# Patient Record
Sex: Female | Born: 1960 | Race: White | Hispanic: No | Marital: Married | Smoking: Former smoker
Health system: Southern US, Community
[De-identification: ages and names within clinical notes are randomized; demographics above are authoritative.]

---

## 2009-12-21 ENCOUNTER — Emergency Department: Payer: Self-pay | Admitting: Unknown Physician Specialty

## 2016-02-27 ENCOUNTER — Encounter: Payer: Self-pay | Admitting: Podiatry

## 2016-02-27 ENCOUNTER — Ambulatory Visit: Payer: BLUE CROSS/BLUE SHIELD

## 2016-02-27 ENCOUNTER — Ambulatory Visit (INDEPENDENT_AMBULATORY_CARE_PROVIDER_SITE_OTHER): Payer: BLUE CROSS/BLUE SHIELD | Admitting: Podiatry

## 2016-02-27 ENCOUNTER — Ambulatory Visit (INDEPENDENT_AMBULATORY_CARE_PROVIDER_SITE_OTHER): Payer: BLUE CROSS/BLUE SHIELD

## 2016-02-27 VITALS — BP 123/77 | HR 81 | Resp 16

## 2016-02-27 DIAGNOSIS — M898X9 Other specified disorders of bone, unspecified site: Secondary | ICD-10-CM

## 2016-02-27 DIAGNOSIS — M779 Enthesopathy, unspecified: Secondary | ICD-10-CM | POA: Diagnosis not present

## 2016-02-27 DIAGNOSIS — M722 Plantar fascial fibromatosis: Secondary | ICD-10-CM

## 2016-02-27 DIAGNOSIS — M79672 Pain in left foot: Secondary | ICD-10-CM

## 2016-02-27 MED ORDER — METHYLPREDNISOLONE 4 MG PO TBPK: ORAL_TABLET | ORAL | 0 refills | Status: AC

## 2016-02-27 MED ORDER — MELOXICAM 15 MG PO TABS: 15.0000 mg | ORAL_TABLET | Freq: Every day | ORAL | 3 refills | Status: DC

## 2016-02-27 MED ORDER — METHYLPREDNISOLONE 4 MG PO TBPK: ORAL_TABLET | ORAL | 0 refills | Status: DC

## 2016-02-27 MED ORDER — MELOXICAM 15 MG PO TABS: 15.0000 mg | ORAL_TABLET | Freq: Every day | ORAL | 3 refills | Status: AC

## 2016-02-27 NOTE — Progress Notes (Signed)
   Subjective:    Patient ID: Catherine Newton, female    DOB: [birth year], 55 y.o.   MRN: [id number]  HPI: She presents today chief complaint of plantar heel pain times the past 3-4 months. Lorazepam 2 band saw Dr. Milinda Caveurnow to clinic who gave her an injection. She is also having tingling along the lateral aspect of the left foot she's doing stretching exercises and she is wearing a support sleeve over-the-counter. She is also wearing over-the-counter inserts with no help. She is concerned about it irritation between the fourth and fifth digits of the right foot. He states that his red area between the toes primarily on the fifth she tries to keep cushion between the toes.  Review of Systems  All other systems reviewed and are negative.      Objective:   Physical Exam: Vital signs are stable she is alert and oriented 3. Pulses are strongly palpable. Neurologic sensorium is intact. Deep tendon reflexes are intact. Muscle strength was 5 over 5 dorsiflexion plantar flexors and inverters and everters all intrinsic musculature is intact. She has pain on palpation medially over the left heel pain on palpation as well as formal plane range of motion of the fourth and fifth metatarsocuboid articulation of the left foot. Right foot demonstrate mild rotated hammertoe deformity fifth right. Radiographs taken today bilaterally demonstrate soft tissue increase in density of plantar fascia calcaneal insertion site of the left foot. No open lesions or wounds. Right foot does demonstrate adductor varus rotated hammertoe deformity with a medial spur to the fifth distal phalanx right. This was resulting and reactive hyperkeratosis and skin irritation and breakdown. No signs of infection at this point.        Assessment & Plan:  Assessment: Plantar fasciitis left foot. Lateral compensatory syndrome left foot.  Plan: Start her on a Medrol Dosepak to be followed by meloxicam. Injected the left heel today with Kenalog  and local anesthetic question plantar fascia brace and a night splint. Discussed appropriate shoe gear stretching exercises ice therapy and shoe modifications. I will follow up with her in 1 month. Many consider orthotics.

## 2016-02-27 NOTE — Patient Instructions (Signed)

## 2016-04-02 ENCOUNTER — Ambulatory Visit (INDEPENDENT_AMBULATORY_CARE_PROVIDER_SITE_OTHER): Payer: BLUE CROSS/BLUE SHIELD | Admitting: Podiatry

## 2016-04-02 DIAGNOSIS — M722 Plantar fascial fibromatosis: Secondary | ICD-10-CM | POA: Diagnosis not present

## 2016-04-02 NOTE — Progress Notes (Signed)
She presents today states that she is approximate 75-80% improved as she refers her plantar fasciitis of her left foot. She states that she has not been wearing the night splint at all and occasionally wears plantar fascia brace. She does relate that she has been taking her meloxicam regularly.  Objective: Vital signs are stable she's alert and oriented 3. Pulses are palpable. Neurologic sensorium is intact degenerative flexor intact 15 on palpation medial continue to build the left heel.  Assessment: Plantar fasciitis left.  Plan: Injected left heel today encouragement pressure brace and night splint use as well as continue use of her anti-inflammatories remember to aspirate how [place] went.

## 2017-11-25 ENCOUNTER — Other Ambulatory Visit: Payer: Self-pay | Admitting: Student

## 2017-11-25 DIAGNOSIS — Z1231 Encounter for screening mammogram for malignant neoplasm of breast: Secondary | ICD-10-CM

## 2019-06-09 ENCOUNTER — Other Ambulatory Visit: Payer: Self-pay | Admitting: Student

## 2019-06-09 DIAGNOSIS — Z1231 Encounter for screening mammogram for malignant neoplasm of breast: Secondary | ICD-10-CM

## 2019-07-07 ENCOUNTER — Ambulatory Visit
Admission: RE | Admit: 2019-07-07 | Discharge: 2019-07-07 | Disposition: A | Discharge status: Home / Self Care | Payer: BC Managed Care – PPO | Source: Ambulatory Visit | Attending: Student | Admitting: Student

## 2019-07-07 ENCOUNTER — Encounter: Payer: Self-pay | Admitting: Radiology

## 2019-07-07 DIAGNOSIS — Z1231 Encounter for screening mammogram for malignant neoplasm of breast: Secondary | ICD-10-CM | POA: Diagnosis not present

## 2021-10-12 IMAGING — MG DIGITAL SCREENING BILAT W/ TOMO W/ CAD
6 of 10 series · 6 of 30 positions shown · non-contrast
Comparison: Previous exam(s).

CLINICAL DATA: Screening.

EXAM:
DIGITAL SCREENING BILATERAL MAMMOGRAM WITH TOMO AND CAD

[R CC synth-2D (1 of 2)]
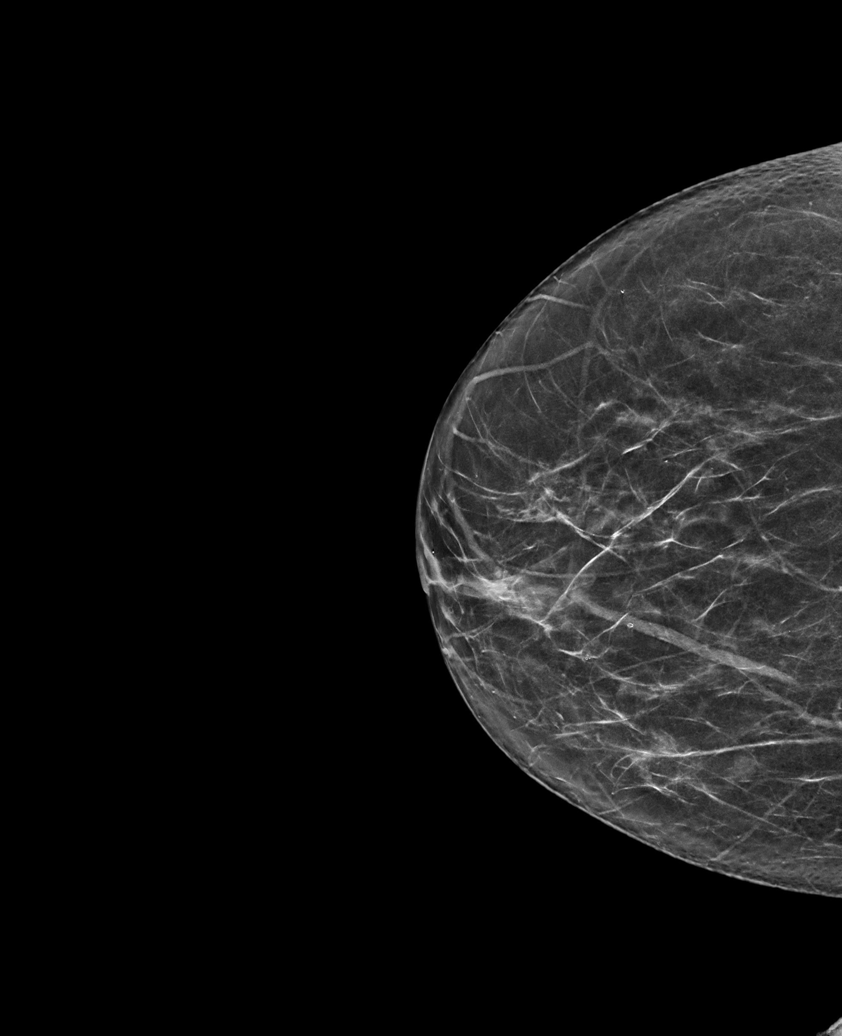

[R CC synth-2D (2 of 2)]
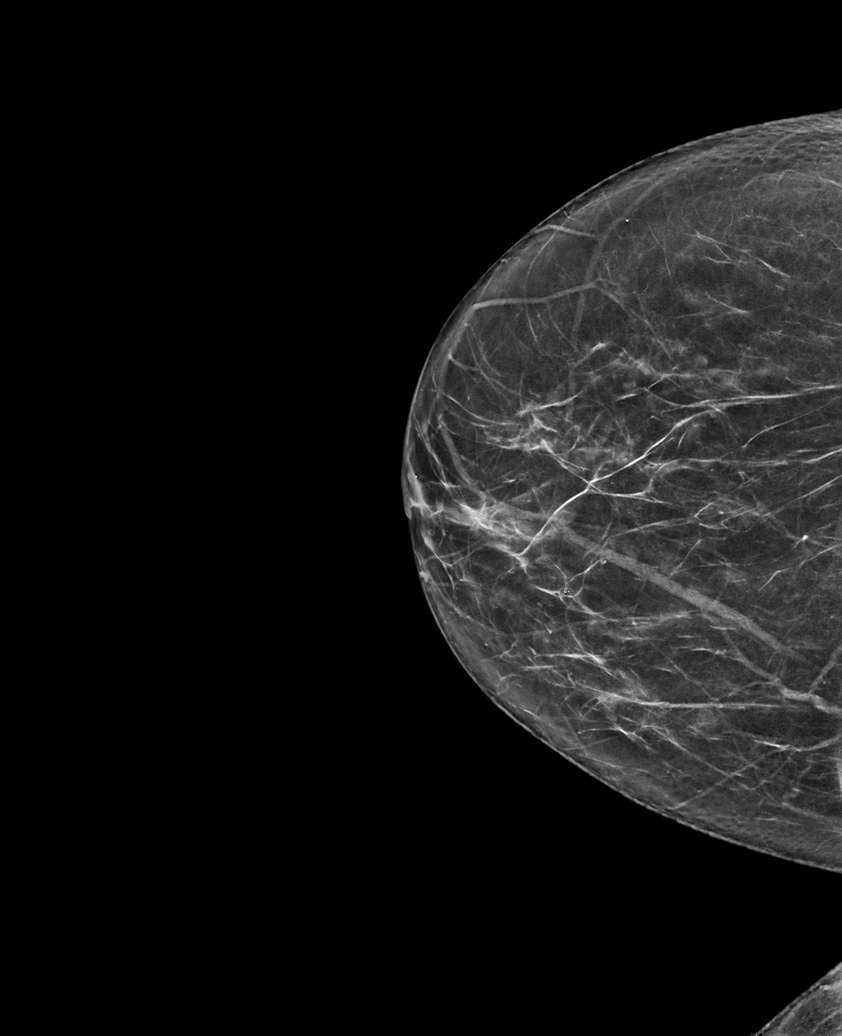

[R MLO synth-2D]
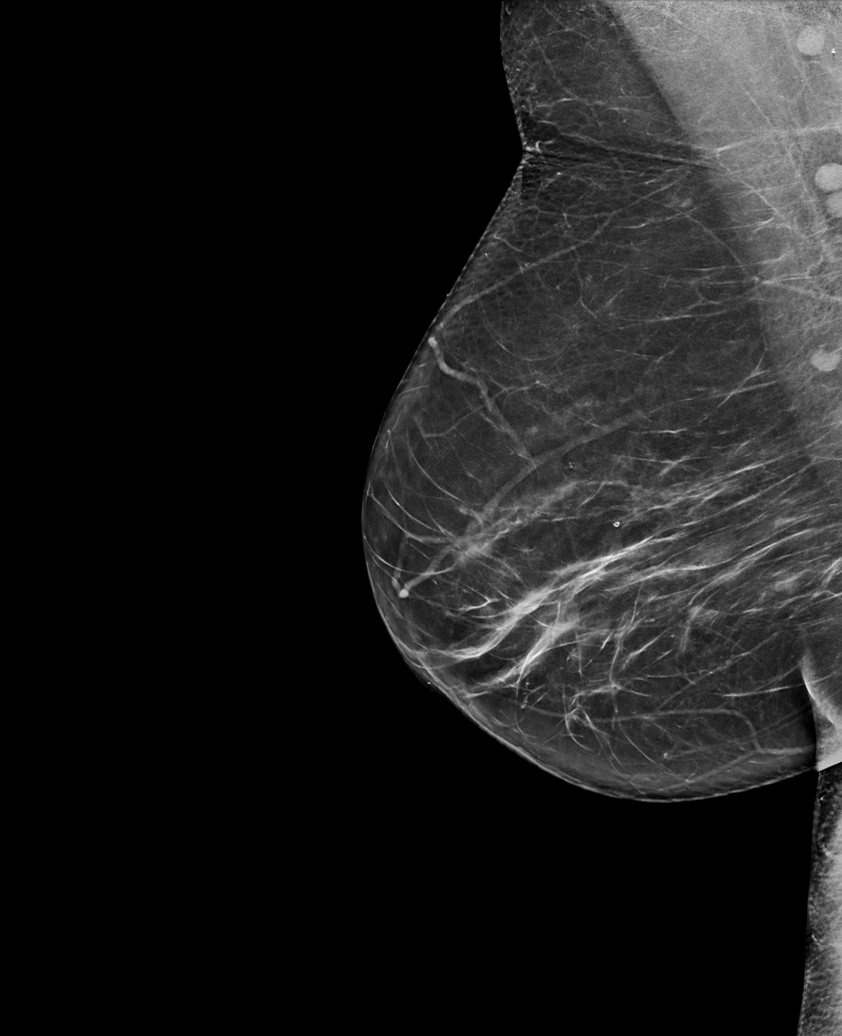

[L CC synth-2D]
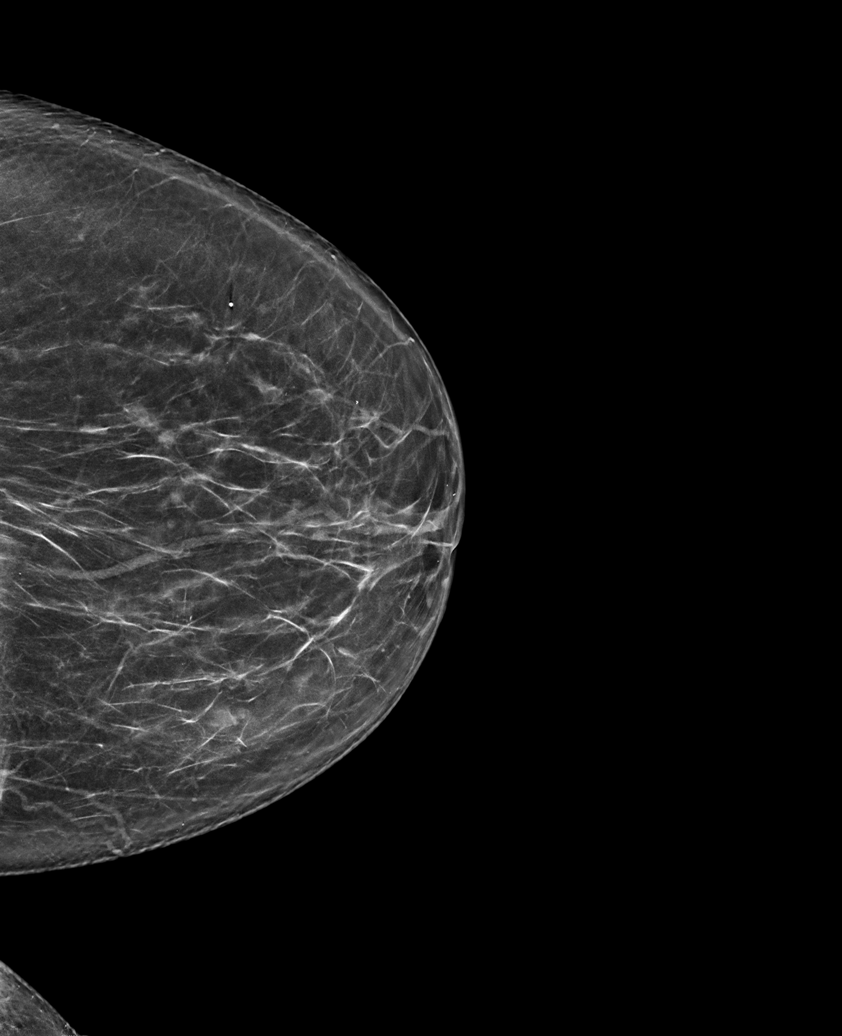

[L MLO synth-2D]
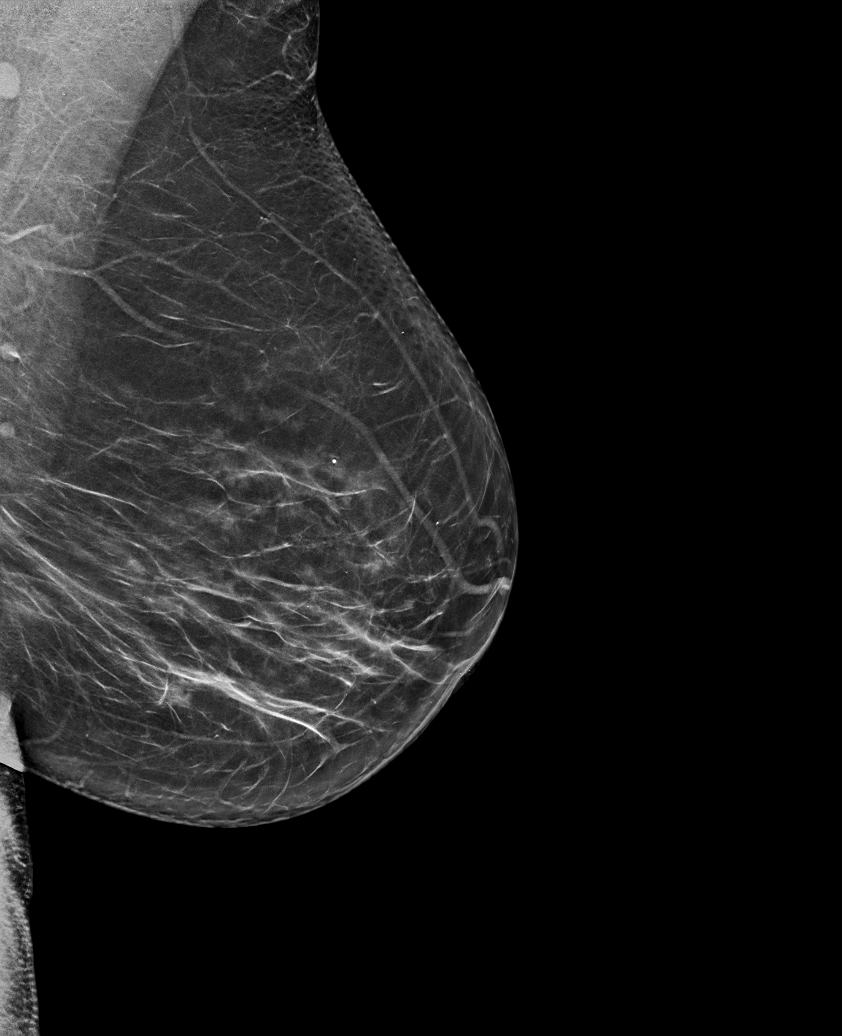

[R CC tomo · tomo slice 35/69.0]
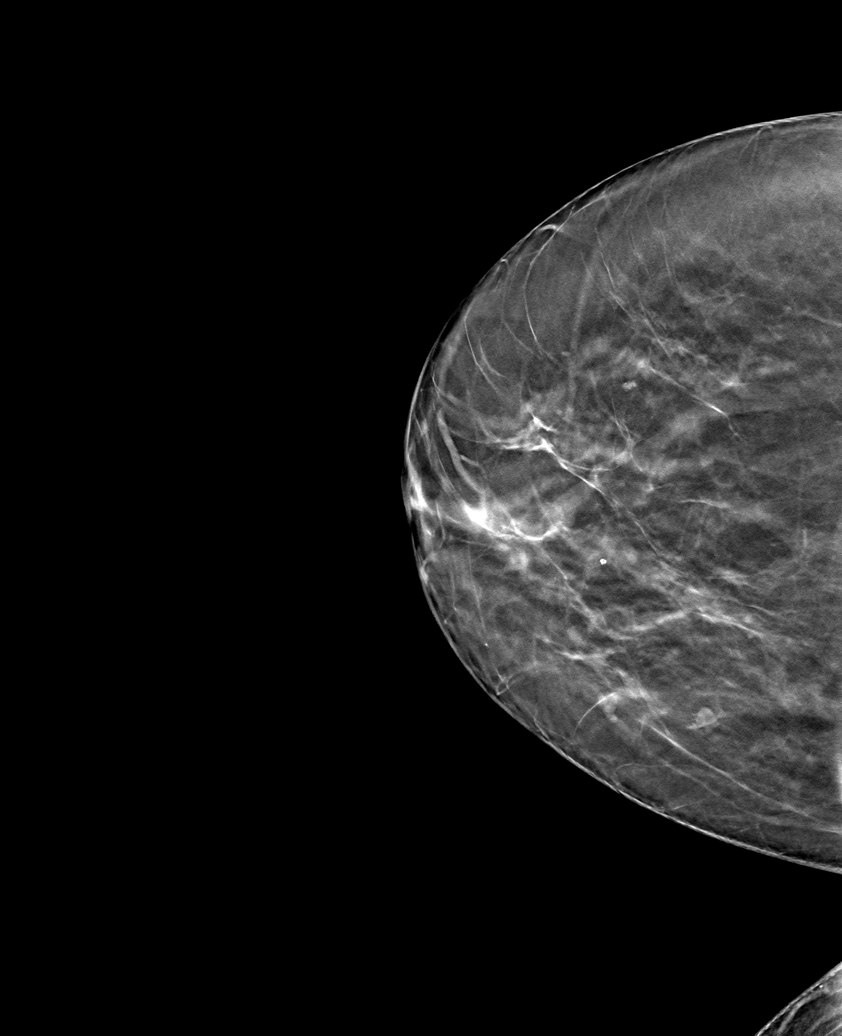

[6 of 30 positions shown; findings below may reference images not displayed]

ACR Breast Density Category b: There are scattered areas of
fibroglandular density.
FINDINGS: There are no findings suspicious for malignancy. Images were
processed with CAD.
IMPRESSION: No mammographic evidence of malignancy. A result letter of this
screening mammogram will be mailed directly to the patient.

RECOMMENDATION:
Screening mammogram in one year. (Code:[id number])

BI-RADS CATEGORY  1: Negative.
